# Patient Record
Sex: Male | Born: 1985 | Race: White | Hispanic: No | Marital: Married | Smoking: Never smoker
Health system: Southern US, Community
[De-identification: ages and names within clinical notes are randomized; demographics above are authoritative.]

---

## 2018-12-24 ENCOUNTER — Emergency Department (HOSPITAL_BASED_OUTPATIENT_CLINIC_OR_DEPARTMENT_OTHER): Payer: BC Managed Care – PPO

## 2018-12-24 ENCOUNTER — Other Ambulatory Visit: Payer: Self-pay

## 2018-12-24 ENCOUNTER — Emergency Department (HOSPITAL_BASED_OUTPATIENT_CLINIC_OR_DEPARTMENT_OTHER)
Admission: EM | Admit: 2018-12-24 | Discharge: 2018-12-24 | Disposition: A | Discharge status: Home / Self Care | Payer: BC Managed Care – PPO | Attending: Emergency Medicine | Admitting: Emergency Medicine

## 2018-12-24 ENCOUNTER — Encounter (HOSPITAL_BASED_OUTPATIENT_CLINIC_OR_DEPARTMENT_OTHER): Payer: Self-pay | Admitting: Emergency Medicine

## 2018-12-24 DIAGNOSIS — R0789 Other chest pain: Secondary | ICD-10-CM | POA: Insufficient documentation

## 2018-12-24 LAB — BASIC METABOLIC PANEL
Anion gap: 5 (ref 5–15)
BUN: 15 mg/dL (ref 6–20)
CO2: 29 mmol/L (ref 22–32)
Calcium: 9.1 mg/dL (ref 8.9–10.3)
Chloride: 105 mmol/L (ref 98–111)
Creatinine, Ser: 0.84 mg/dL (ref 0.61–1.24)
GFR calc Af Amer: >60 mL/min (ref >60)
GFR calc non Af Amer: >60 mL/min (ref >60)
Glucose, Bld: 94 mg/dL (ref 70–99)
Potassium: 4 mmol/L (ref 3.5–5.1)
Sodium: 139 mmol/L (ref 135–145)

## 2018-12-24 LAB — CBC
HCT: 44.1 % (ref 39.0–52.0)
Hemoglobin: 14.7 g/dL (ref 13.0–17.0)
MCH: 29.1 pg (ref 26.0–34.0)
MCHC: 33.3 g/dL (ref 30.0–36.0)
MCV: 87.2 fL (ref 80.0–100.0)
Platelets: 287 10*3/uL (ref 150–400)
RBC: 5.06 MIL/uL (ref 4.22–5.81)
RDW: 13.6 % (ref 11.5–15.5)
WBC: 5.6 10*3/uL (ref 4.0–10.5)
nRBC: 0 % (ref 0.0–0.2)

## 2018-12-24 LAB — TROPONIN I (HIGH SENSITIVITY): Troponin I (High Sensitivity): <2 ng/L (ref <18)

## 2018-12-24 MED ORDER — KETOROLAC TROMETHAMINE 30 MG/ML IJ SOLN: 30.0000 mg | Freq: Once | INTRAMUSCULAR | Status: DC

## 2018-12-24 MED ORDER — KETOROLAC TROMETHAMINE 60 MG/2ML IM SOLN: 60.0000 mg | Freq: Once | INTRAMUSCULAR | Status: DC

## 2018-12-24 NOTE — Discharge Instructions (Addendum)
Ibuprofen 600 mg every 6 hours as needed for pain.  Follow-up with primary doctor if not improving in the next few days, and return to the ER if you develop worsening pain, difficulty breathing, high fever, or other issues.

## 2018-12-24 NOTE — ED Triage Notes (Signed)
R side chest pain today. Denies SOB

## 2018-12-24 NOTE — ED Provider Notes (Signed)
[facility] EMERGENCY DEPARTMENT Provider Note   CSN: [id number] Arrival date & time: 12/24/18  1151     History Chief Complaint  Patient presents with  . Chest Pain    Mark Frederick is a 33 y.o. male.  Patient is a 33 year old male with no significant past medical history.  He presents today with complaints of chest pain.  This started approximately 2 hours prior to arrival.  He describes a "pulling" to the right upper chest that is constant, but worse when he moves.  He denies any shortness of breath, nausea, diaphoresis, or radiation to the arm or jaw.  He denies any fevers or chills.  He denies any productive cough.  He tells me he had a similar episode while he was living in [place] months ago.  He was placed on a Holter monitor, however no incidents occurred.  He has since moved here and has had no further issues or care.  Risk factors include a family history.  He states his mother recently required a stent and that his older brother has had multiple strokes believed to be related to his blood pressure.  The history is provided by the patient.  Chest Pain Pain location:  R chest Pain radiates to:  Does not radiate Pain severity:  Moderate Onset quality:  Sudden Timing:  Constant Chronicity:  Recurrent Relieved by:  Nothing Worsened by:  Movement and certain positions      History reviewed. No pertinent past medical history.  There are no problems to display for this patient.   History reviewed. No pertinent surgical history.     No family history on file.  Social History   Tobacco Use  . Smoking status: Never Smoker  . Smokeless tobacco: Never Used  Substance Use Topics  . Alcohol use: Yes  . Drug use: Never    Home Medications Prior to Admission medications   Not on File    Allergies    Patient has no known allergies.  Review of Systems   Review of Systems  Cardiovascular: Positive for chest pain.  All other systems reviewed  and are negative.   Physical Exam Updated Vital Signs BP 121/83 (BP Location: Right Arm)   Pulse 73   Temp 98.1 F (36.7 C) (Oral)   Resp 16   Ht 5\' 8"  (1.727 m)   Wt 74.8 kg   SpO2 98%   BMI 25.09 kg/m   Physical Exam Vitals and nursing note reviewed.  Constitutional:      General: He is not in acute distress.    Appearance: He is well-developed. He is not diaphoretic.  HENT:     Head: Normocephalic and atraumatic.  Cardiovascular:     Rate and Rhythm: Normal rate and regular rhythm.     Heart sounds: No murmur. No friction rub.  Pulmonary:     Effort: Pulmonary effort is normal. No respiratory distress.     Breath sounds: Normal breath sounds. No wheezing or rales.  Abdominal:     General: Bowel sounds are normal. There is no distension.     Palpations: Abdomen is soft.     Tenderness: There is no abdominal tenderness.  Musculoskeletal:        General: Normal range of motion.     Cervical back: Normal range of motion and neck supple.  Skin:    General: Skin is warm and dry.  Neurological:     Mental Status: He is alert and oriented to person, place, and  time.     Coordination: Coordination normal.     ED Results / Procedures / Treatments   Labs (all labs ordered are listed, but only abnormal results are displayed) Labs Reviewed  BASIC METABOLIC PANEL  CBC  TROPONIN I (HIGH SENSITIVITY)    EKG EKG Interpretation  Date/Time:  Sunday December 24 2018 11:57:34 EST Ventricular Rate:  73 PR Interval:  124 QRS Duration: 78 QT Interval:  352 QTC Calculation: 387 R Axis:   76 Text Interpretation: Normal sinus rhythm Normal ECG Confirmed by Veryl Speak [phone]) on 12/24/2018 12:10:32 PM   Radiology No results found.  Procedures Procedures (including critical care time)  Medications Ordered in ED Medications  ketorolac (TORADOL) 30 MG/ML injection 30 mg (has no administration in time range)    ED Course  I have reviewed the triage vital signs and  the nursing notes.  Pertinent labs & imaging results that were available during my care of the patient were reviewed by me and considered in my medical decision making (see chart for details).    MDM Rules/Calculators/A&P  Patient presents here with complaints of chest discomfort.  Symptoms are atypical for cardiac pain and work-up is thus far unremarkable.  His EKG is normal and troponin is less than 2.  Chest x-ray is clear.  At this point, I do not feel as though further work-up is indicated.  Patient will be discharged with as needed return.  Final Clinical Impression(s) / ED Diagnoses Final diagnoses:  None    Rx / DC Orders ED Discharge Orders    None       Veryl Speak, MD 12/24/18 1410

## 2020-12-29 IMAGING — DX DG CHEST 1V PORT
1 series · 1 of 1 positions shown · non-contrast
Comparison: None.

CLINICAL DATA: Right-sided chest pain.

EXAM:
PORTABLE CHEST 1 VIEW

[chest ap]
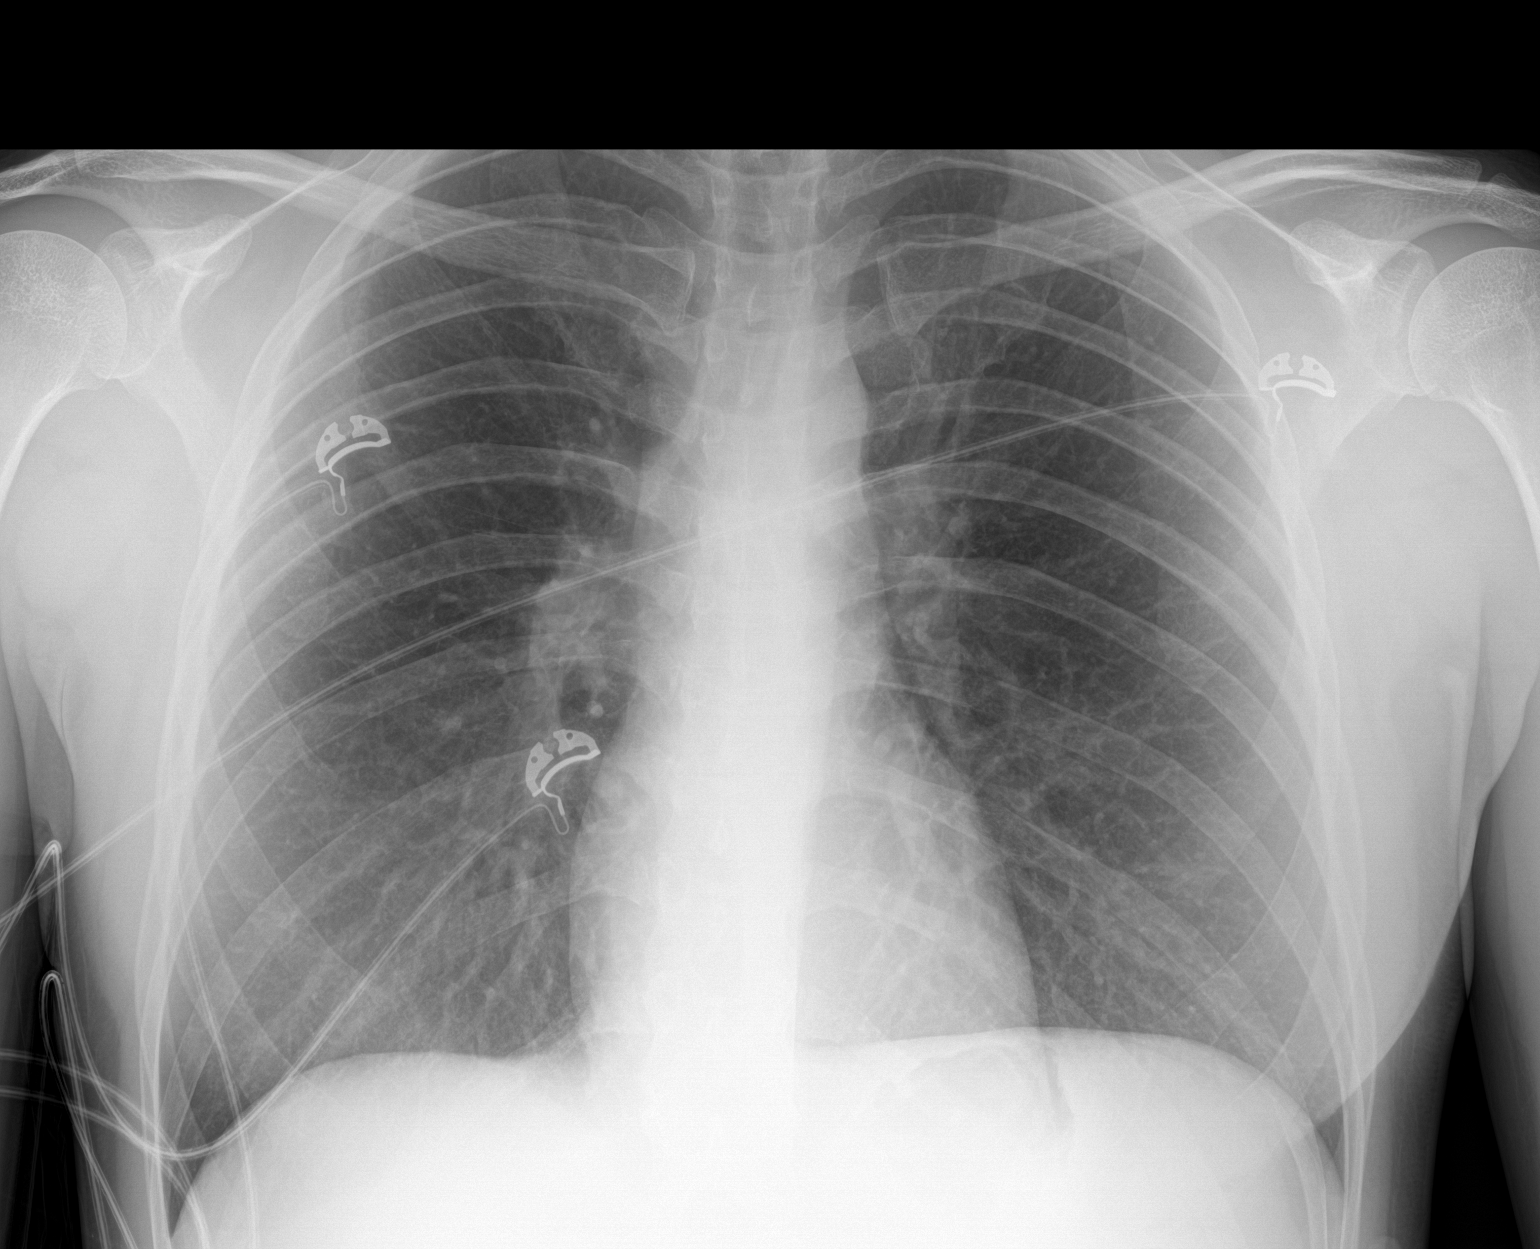

[1 of 1 positions shown; findings below may reference images not displayed]

FINDINGS: The heart size and mediastinal contours are within normal limits.
Both lungs are clear. The visualized skeletal structures are
unremarkable.
IMPRESSION: No active disease.
